# Patient Record
Sex: Male | Born: 1980 | Race: White | Hispanic: No | Marital: Single | Smoking: Never smoker
Health system: Southern US, Community
[De-identification: ages and names within clinical notes are randomized; demographics above are authoritative.]

## PROBLEM LIST (undated history)

## (undated) DIAGNOSIS — N200 Calculus of kidney: Secondary | ICD-10-CM

---

## 2016-06-29 ENCOUNTER — Emergency Department (HOSPITAL_COMMUNITY)
Admission: EM | Admit: 2016-06-29 | Discharge: 2016-06-29 | Disposition: A | Discharge status: Home / Self Care | Payer: Self-pay | Attending: Emergency Medicine | Admitting: Emergency Medicine

## 2016-06-29 ENCOUNTER — Encounter (HOSPITAL_COMMUNITY): Payer: Self-pay | Admitting: Emergency Medicine

## 2016-06-29 ENCOUNTER — Emergency Department (HOSPITAL_COMMUNITY): Payer: Self-pay

## 2016-06-29 DIAGNOSIS — F17220 Nicotine dependence, chewing tobacco, uncomplicated: Secondary | ICD-10-CM | POA: Insufficient documentation

## 2016-06-29 DIAGNOSIS — R1031 Right lower quadrant pain: Secondary | ICD-10-CM | POA: Insufficient documentation

## 2016-06-29 DIAGNOSIS — Z79899 Other long term (current) drug therapy: Secondary | ICD-10-CM | POA: Insufficient documentation

## 2016-06-29 HISTORY — DX: Calculus of kidney: N20.0

## 2016-06-29 LAB — CBC WITH DIFFERENTIAL/PLATELET
BASOS ABS: 0 10*3/uL (ref 0.0–0.1)
Basophils Relative: 0 %
EOS ABS: 0 10*3/uL (ref 0.0–0.7)
EOS PCT: 1 %
HCT: 33.2 % — ABNORMAL LOW (ref 39.0–52.0)
Hemoglobin: 11.4 g/dL — ABNORMAL LOW (ref 13.0–17.0)
LYMPHS PCT: 23 %
Lymphs Abs: 0.8 10*3/uL (ref 0.7–4.0)
MCH: 29.9 pg (ref 26.0–34.0)
MCHC: 34.3 g/dL (ref 30.0–36.0)
MCV: 87.1 fL (ref 78.0–100.0)
MONO ABS: 0.4 10*3/uL (ref 0.1–1.0)
Monocytes Relative: 13 %
Neutro Abs: 2.1 10*3/uL (ref 1.7–7.7)
Neutrophils Relative %: 63 %
PLATELETS: 143 10*3/uL — AB (ref 150–400)
RBC: 3.81 MIL/uL — ABNORMAL LOW (ref 4.22–5.81)
RDW: 13.3 % (ref 11.5–15.5)
WBC: 3.3 10*3/uL — ABNORMAL LOW (ref 4.0–10.5)

## 2016-06-29 LAB — COMPREHENSIVE METABOLIC PANEL
ALBUMIN: 3.9 g/dL (ref 3.5–5.0)
ALK PHOS: 62 U/L (ref 38–126)
ALT: 17 U/L (ref 17–63)
AST: 21 U/L (ref 15–41)
Anion gap: 5 (ref 5–15)
BILIRUBIN TOTAL: 1.2 mg/dL (ref 0.3–1.2)
BUN: 5 mg/dL — AB (ref 6–20)
CALCIUM: 9 mg/dL (ref 8.9–10.3)
CO2: 29 mmol/L (ref 22–32)
CREATININE: 0.78 mg/dL (ref 0.61–1.24)
Chloride: 107 mmol/L (ref 101–111)
GFR calc Af Amer: >60 mL/min (ref >60)
GLUCOSE: 86 mg/dL (ref 65–99)
Potassium: 3.6 mmol/L (ref 3.5–5.1)
Sodium: 141 mmol/L (ref 135–145)
TOTAL PROTEIN: 6.7 g/dL (ref 6.5–8.1)

## 2016-06-29 LAB — URINALYSIS, ROUTINE W REFLEX MICROSCOPIC
BILIRUBIN URINE: NEGATIVE
GLUCOSE, UA: NEGATIVE mg/dL
HGB URINE DIPSTICK: NEGATIVE
KETONES UR: NEGATIVE mg/dL
Leukocytes, UA: NEGATIVE
Nitrite: NEGATIVE
PROTEIN: NEGATIVE mg/dL
Specific Gravity, Urine: 1.004 — ABNORMAL LOW (ref 1.005–1.030)
pH: 7.5 (ref 5.0–8.0)

## 2016-06-29 MED ORDER — MORPHINE SULFATE (PF) 4 MG/ML IV SOLN
4.0000 mg | Freq: Once | INTRAVENOUS | Status: AC
  Administered 2016-06-29: 4 mg via INTRAVENOUS
  Filled 2016-06-29: qty 1

## 2016-06-29 MED ORDER — PROMETHAZINE HCL 25 MG PO TABS: 25.0000 mg | ORAL_TABLET | Freq: Four times a day (QID) | ORAL | 0 refills | Status: AC | PRN

## 2016-06-29 MED ORDER — IOPAMIDOL (ISOVUE-300) INJECTION 61%
100.0000 mL | Freq: Once | INTRAVENOUS | Status: AC | PRN
  Administered 2016-06-29: 100 mL via INTRAVENOUS

## 2016-06-29 MED ORDER — ONDANSETRON HCL 4 MG/2ML IJ SOLN
4.0000 mg | Freq: Once | INTRAMUSCULAR | Status: AC
  Administered 2016-06-29: 4 mg via INTRAVENOUS
  Filled 2016-06-29: qty 2

## 2016-06-29 NOTE — Discharge Instructions (Signed)
Tylenol or motrin for pain. Phenergan for nausea. Follow up with your doctor. Return if worsening.

## 2016-06-29 NOTE — ED Provider Notes (Signed)
[facility]-EMERGENCY DEPT Provider Note   CSN: [id number] Arrival date & time: 06/29/16  1015     History   Chief Complaint Chief Complaint  Patient presents with  . Abdominal Pain    HPI Mallie Linnemann is a 35 y.o. male.  HPI Taite Schoeppner is a 35 y.o. male with history of kidney stones, presents to emergency department complaining of abdominal pain. Patient states his abdominal pain started 1 week ago. He was seen in [place] at that time, had imaging done and was told that his "appendix was swollen." Patient reports he was admitted, but states he did not performed appendectomy because "I was too far away from home." He states that they treated him with pain medications and antibiotics, and he was discharged home. He states that his pain came back several days ago. He states pain is in the right lower quadrant, worse with movement and palpation. Denies any nausea or vomiting. Reports anorexia. No change in his bowels. No flank pain. No hematuria. No urinary symptoms. Patient took Tylenol for pain this morning which did not help.  Past Medical History:  Diagnosis Date  . Kidney stones     There are no active problems to display for this patient.   History reviewed. No pertinent surgical history.     Home Medications    Prior to Admission medications   Medication Sig Start Date End Date Taking? Authorizing Provider  oxyCODONE-acetaminophen (PERCOCET/ROXICET) 5-325 MG tablet Take 1 tablet by mouth every 6 (six) hours as needed for pain. 06/27/16  Yes Historical Provider, MD  sulfamethoxazole-trimethoprim (BACTRIM DS,SEPTRA DS) 800-160 MG tablet Take 1 tablet by mouth 2 (two) times daily.    Historical Provider, MD    Family History History reviewed. No pertinent family history.  Social History Social History  Substance Use Topics  . Smoking status: Never Smoker  . Smokeless tobacco: Current User    Types: Chew  . Alcohol use Yes     Comment: occassional      Allergies   Review of patient's allergies indicates no known allergies.   Review of Systems Review of Systems  Constitutional: Positive for appetite change. Negative for chills and fever.  Respiratory: Negative for cough, chest tightness and shortness of breath.   Cardiovascular: Negative for chest pain, palpitations and leg swelling.  Gastrointestinal: Positive for abdominal pain, nausea and vomiting. Negative for abdominal distention and diarrhea.  Musculoskeletal: Negative for arthralgias, myalgias, neck pain and neck stiffness.  Skin: Negative for rash.  Allergic/Immunologic: Negative for immunocompromised state.  Neurological: Negative for dizziness, weakness, light-headedness, numbness and headaches.  All other systems reviewed and are negative.    Physical Exam Updated Vital Signs BP 129/76   Pulse 86   Temp 98.5 F (36.9 C) (Oral)   Ht 5\' 10"  (1.778 m)   Wt 59 kg   SpO2 100%   BMI 18.65 kg/m   Physical Exam  Constitutional: He appears well-developed and well-nourished. No distress.  HENT:  Head: Normocephalic and atraumatic.  Eyes: Conjunctivae are normal.  Neck: Neck supple.  Cardiovascular: Normal rate, regular rhythm and normal heart sounds.   Pulmonary/Chest: Effort normal. No respiratory distress. He has no wheezes. He has no rales.  Abdominal: Soft. Bowel sounds are normal. He exhibits no distension. There is tenderness. There is no rebound.  Right lower quadrant tenderness with guarding  Musculoskeletal: He exhibits no edema.  Neurological: He is alert.  Skin: Skin is warm and dry.  Nursing note and vitals reviewed.  ED Treatments / Results  Labs (all labs ordered are listed, but only abnormal results are displayed) Labs Reviewed  COMPREHENSIVE METABOLIC PANEL - Abnormal; Notable for the following:       Result Value   BUN 5 (*)    All other components within normal limits  URINALYSIS, ROUTINE W REFLEX MICROSCOPIC (NOT AT [facility]) -  Abnormal; Notable for the following:    Specific Gravity, Urine 1.004 (*)    All other components within normal limits  CBC WITH DIFFERENTIAL/PLATELET - Abnormal; Notable for the following:    WBC 3.3 (*)    RBC 3.81 (*)    Hemoglobin 11.4 (*)    HCT 33.2 (*)    Platelets 143 (*)    All other components within normal limits  CBC WITH DIFFERENTIAL/PLATELET    EKG  EKG Interpretation None       Radiology Ct Abdomen Pelvis W Contrast  Result Date: 06/29/2016 CLINICAL DATA:  Right lower quadrant abdominal pain for 1 week. EXAM: CT ABDOMEN AND PELVIS WITH CONTRAST TECHNIQUE: Multidetector CT imaging of the abdomen and pelvis was performed using the standard protocol following bolus administration of intravenous contrast. CONTRAST:  ISOVUE-300 IOPAMIDOL (ISOVUE-300) INJECTION 61% COMPARISON:  None. FINDINGS: Lower chest: Large calcified granuloma is noted in right posterior costophrenic sulcus. Otherwise lung bases are unremarkable. Hepatobiliary: No gallstones are noted.  The liver appears normal. Pancreas: Normal. Spleen: Normal. Adrenals/Urinary Tract: Adrenal glands and kidneys appear normal. No hydronephrosis or renal obstruction is noted. Urinary bladder appears normal. Stomach/Bowel: The appendix appears normal. There is no evidence of bowel obstruction. Vascular/Lymphatic: No significant adenopathy is noted. Reproductive: Normal prostate gland. Other: No abnormal fluid collection is noted. Musculoskeletal: No significant osseous abnormality is noted. IMPRESSION: No acute abnormality seen in the abdomen or pelvis. Electronically Signed   By: Lupita Raider, M.D.   On: 06/29/2016 13:00    Procedures Procedures (including critical care time)  Medications Ordered in ED Medications  morphine 4 MG/ML injection 4 mg (4 mg Intravenous Given 06/29/16 1128)  ondansetron (ZOFRAN) injection 4 mg (4 mg Intravenous Given 06/29/16 1128)  iopamidol (ISOVUE-300) 61 % injection 100 mL (100  mLs Intravenous Contrast Given 06/29/16 1236)  morphine 4 MG/ML injection 4 mg (4 mg Intravenous Given 06/29/16 1305)  ondansetron (ZOFRAN) injection 4 mg (4 mg Intravenous Given 06/29/16 1304)     Initial Impression / Assessment and Plan / ED Course  I have reviewed the triage vital signs and the nursing notes.  Pertinent labs & imaging results that were available during my care of the patient were reviewed by me and considered in my medical decision making (see chart for details).  Clinical Course   Patient emergency department with right lower quadrant pain. He reports history of being admitted in [place] for "swollen appendix "but states they did not perform surgery, he was treated with antibiotics and pain medications and was discharged. He states pain is now worse. He reports anorexia, pain with movement, pain with palpation of right lower quadrant. Will check labs, repeat CT abdomen and pelvis, will try obtaining records from [facility].  1:30 PM Patient's urinalysis is normal in ED. No hematuria, no evidence of infection. Blood work is normal. CT scan with no acute findings. I did receive records from patient's hospitalization last week, which did have a CT with findings of inflammation in the right lower quadrant as well as possible right hydronephrosis. Patient actually grew infection his urine he was treated  with Bactrim at that time. His symptoms improved. Appendicitis was ruled out by improving symptoms and serial exams. Patient was discharged home with Percocet for pain. I discussed results with patient. He requested pain medications. Advised to take Tylenol or Motrin for pain. Will give prescription for phenergan for nausea. Follow up with pcp. Return precautions discussed.   Vitals:   06/29/16 1026 06/29/16 1128 06/29/16 1200 06/29/16 1304  BP:  113/69 110/74 107/70  Pulse:  77 64 (!) 58  Resp:  18 18 18   Temp:      TempSrc:      SpO2:  100% 100% 100%   Weight: 59 kg     Height: 5\' 10"  (1.778 m)       Final Clinical Impressions(s) / ED Diagnoses   Final diagnoses:  Right lower quadrant abdominal pain    New Prescriptions New Prescriptions   PROMETHAZINE (PHENERGAN) 25 MG TABLET    Take 1 tablet (25 mg total) by mouth every 6 (six) hours as needed for nausea or vomiting.     Jaynie Crumbleatyana Kemesha Mosey, PA-C 06/29/16 1335    Lyndal Pulleyaniel Knott, MD 06/29/16 2003

## 2016-06-29 NOTE — ED Notes (Signed)
Patient transported to CT 

## 2016-06-29 NOTE — ED Triage Notes (Addendum)
Patient reports RLQ abdominal pain with N/V/D.  Reports seen for same 8 days ago in [place].  Reports he was told appendix and kidney were enlarged and he was admitted to hospital.  Reports he was discharged last Wednesday from hospital.  Reports pain has come back and symptoms have returned.  Denies fever, dysuria. Patient was taking antibiotics and finished RX on Monday.

## 2016-06-29 NOTE — ED Notes (Signed)
PA at bedside.

## 2018-03-20 IMAGING — CT CT ABD-PELV W/ CM
2 of 4 series · 17 of 46 positions shown, 19 images · IV contrast (iopamidol)
Comparison: None.

CLINICAL DATA: Right lower quadrant abdominal pain for 1 week.

EXAM:
CT ABDOMEN AND PELVIS WITH CONTRAST
TECHNIQUE: Multidetector CT imaging of the abdomen and pelvis was performed
using the standard protocol following bolus administration of
intravenous contrast.
CONTRAST:  100mL SGI6Z3-M00 IOPAMIDOL (SGI6Z3-M00) INJECTION 61%

[Series 2: abd/pel with · axial · 0.68mm/px · z∈[-528,-158]mm · 14 of 84 slices shown, 16 images]
[im 5/84  soft-tissue]
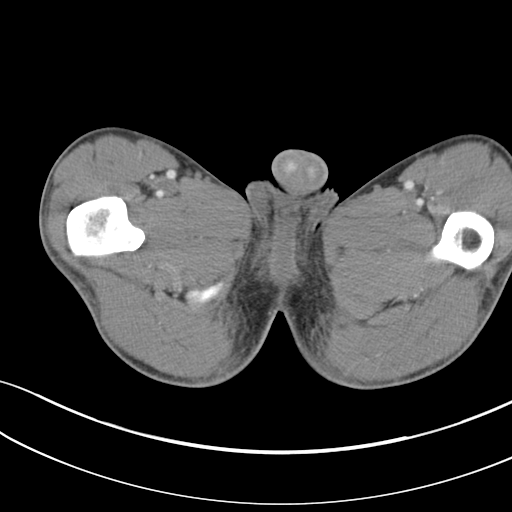
[im 5/84  bone]
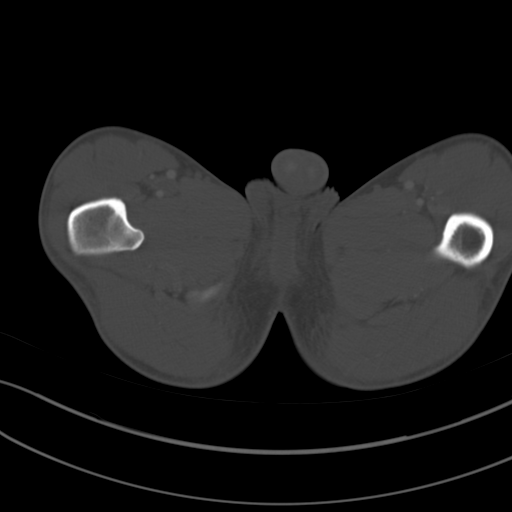
[im 13/84  soft-tissue]
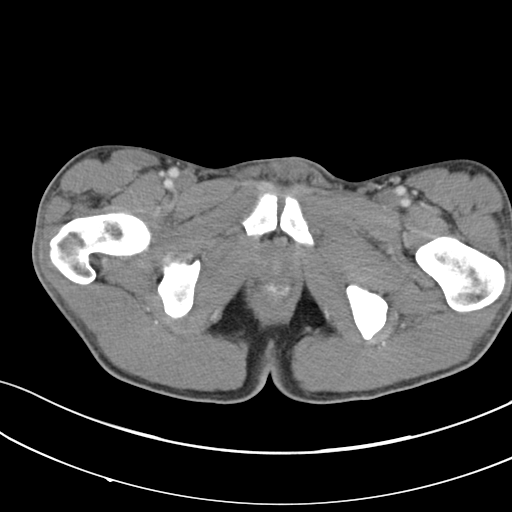
[im 17/84  soft-tissue]
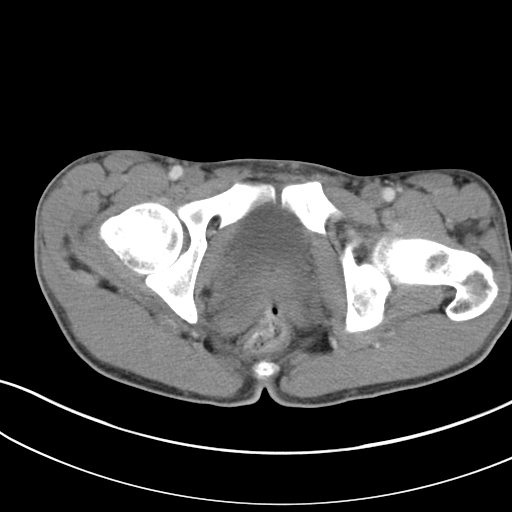
[im 21/84  soft-tissue]
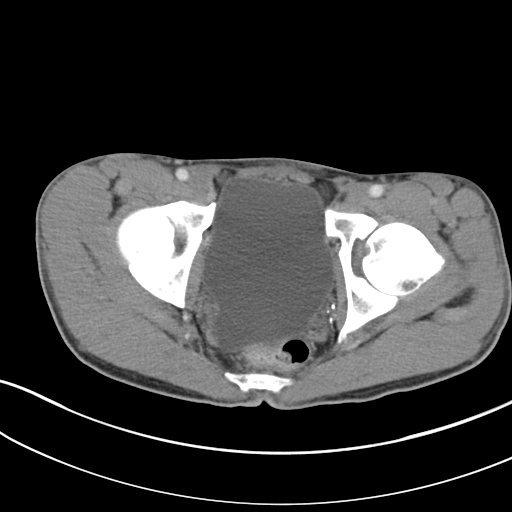
[im 30/84  soft-tissue]
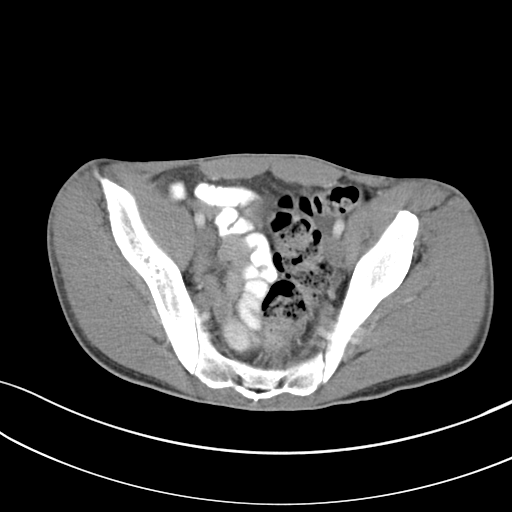
[im 34/84  soft-tissue]
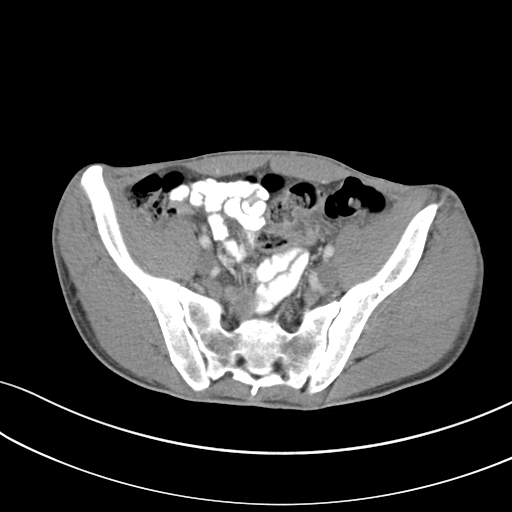
[im 38/84  soft-tissue]
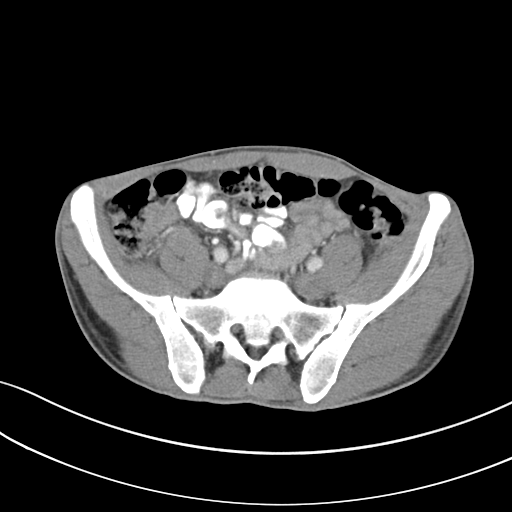
[im 46/84  soft-tissue]
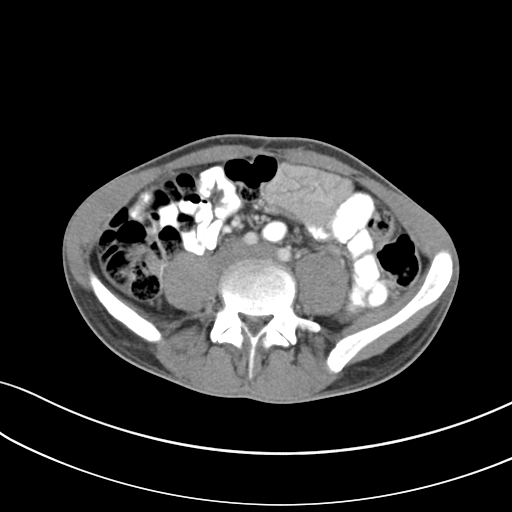
[im 50/84  soft-tissue]
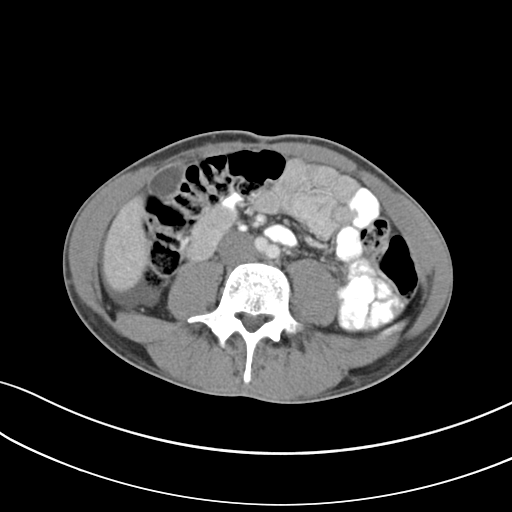
[im 50/84  bone]
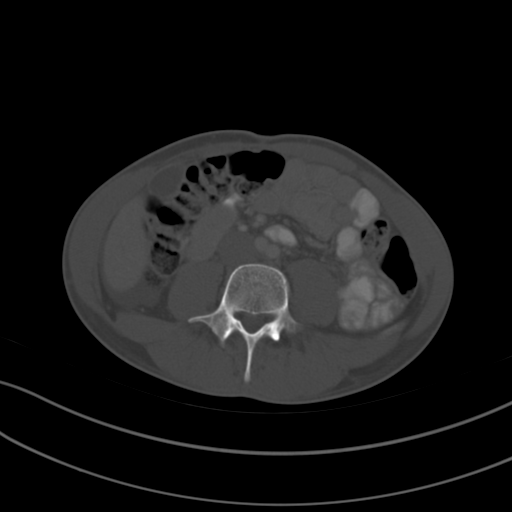
[im 54/84  soft-tissue]
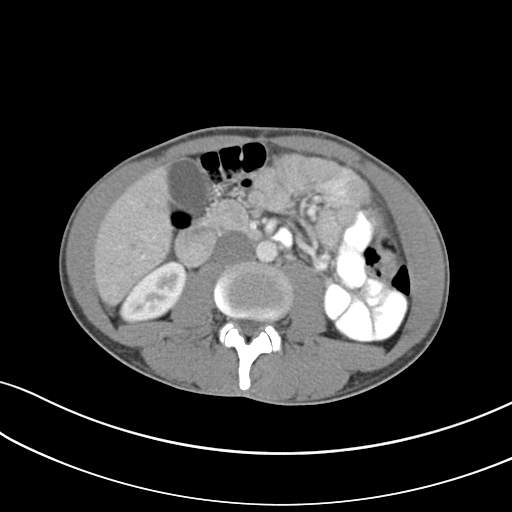
[im 63/84  soft-tissue]
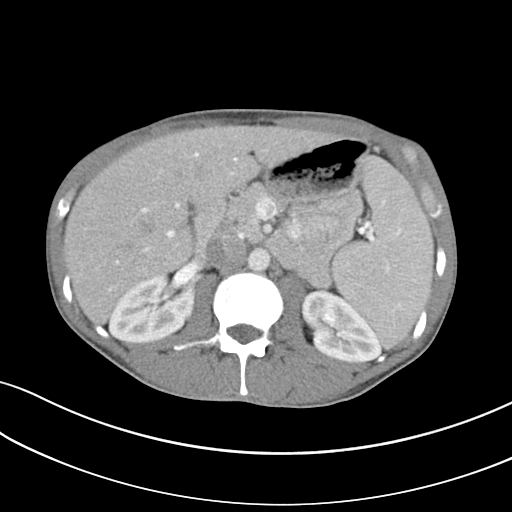
[im 67/84  soft-tissue]
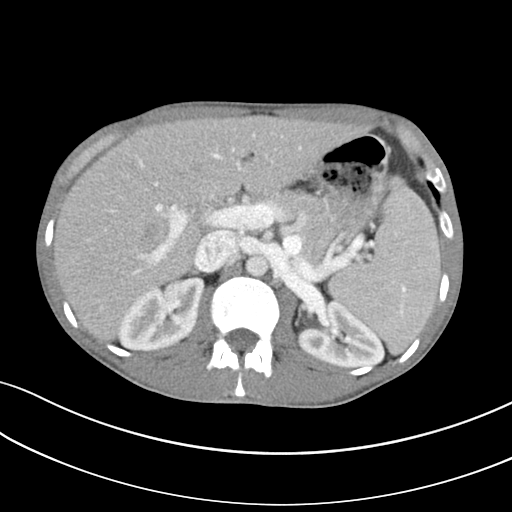
[im 71/84  soft-tissue]
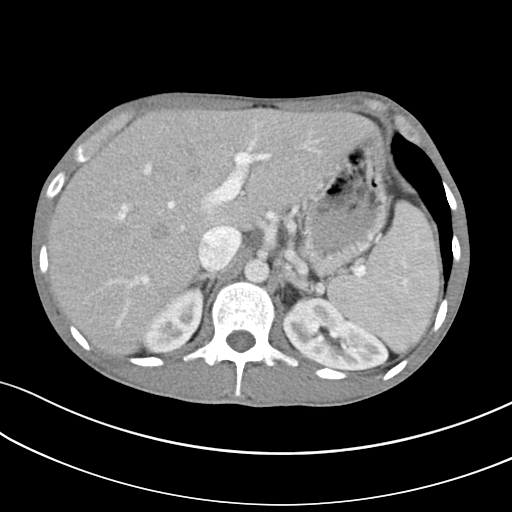
[im 79/84  soft-tissue]
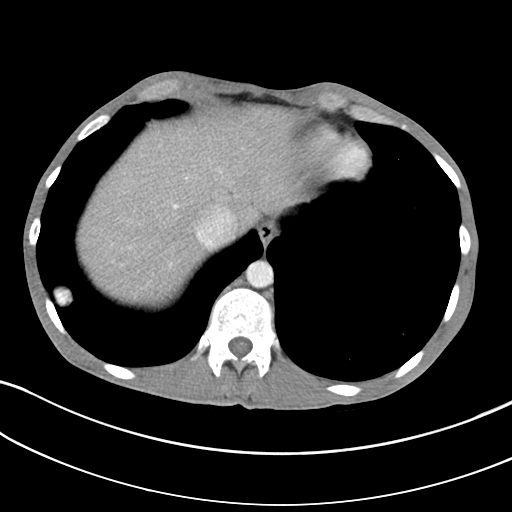

[Series 5: coronal a/|p · coronal · 0.74mm/px · 3 of 119 slices shown]
[im 53/119  soft-tissue]
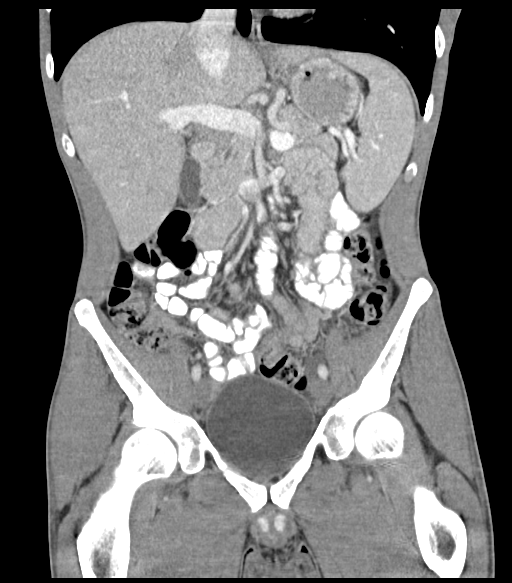
[im 66/119  soft-tissue]
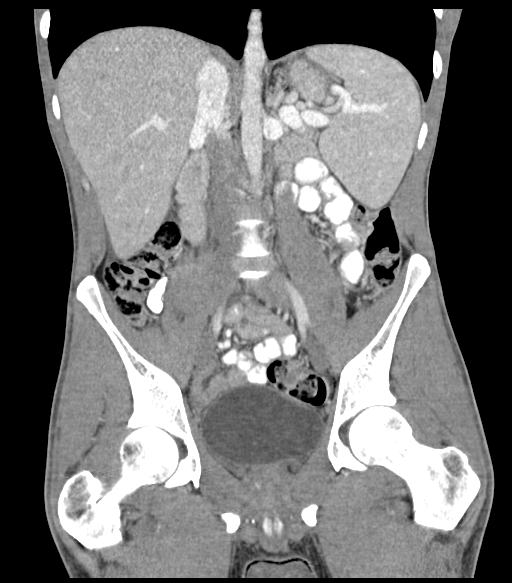
[im 79/119  soft-tissue]
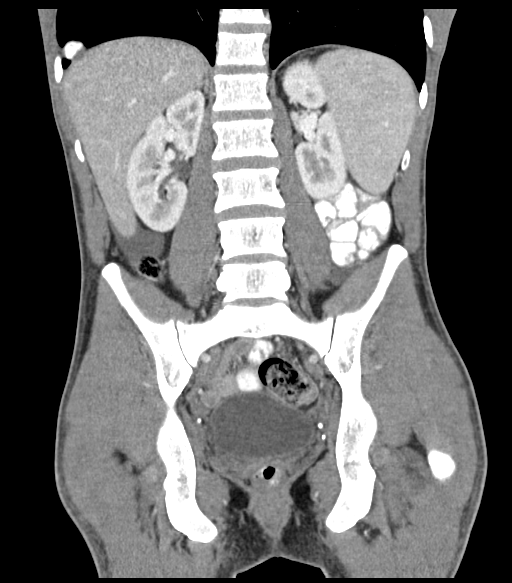

[17 of 46 positions shown; findings below may reference images not displayed]

FINDINGS: Lower chest: Large calcified granuloma is noted in right posterior
costophrenic sulcus. Otherwise lung bases are unremarkable.

Hepatobiliary: No gallstones are noted.  The liver appears normal.

Pancreas: Normal.

Spleen: Normal.

Adrenals/Urinary Tract: Adrenal glands and kidneys appear normal. No
hydronephrosis or renal obstruction is noted. Urinary bladder
appears normal.

Stomach/Bowel: The appendix appears normal. There is no evidence of
bowel obstruction.

Vascular/Lymphatic: No significant adenopathy is noted.

Reproductive: Normal prostate gland.

Other: No abnormal fluid collection is noted.

Musculoskeletal: No significant osseous abnormality is noted.
IMPRESSION: No acute abnormality seen in the abdomen or pelvis.
